# Patient Record
Sex: Female | Born: 1965 | Race: Black or African American | Hispanic: No | Marital: Married | Smoking: Never smoker
Health system: Southern US, Community
[De-identification: ages and names within clinical notes are randomized; demographics above are authoritative.]

## PROBLEM LIST (undated history)

## (undated) HISTORY — PX: TUBAL LIGATION: SHX77

## (undated) HISTORY — PX: UTERINE FIBROID SURGERY: SHX826

---

## 1999-05-16 ENCOUNTER — Other Ambulatory Visit: Admission: RE | Admit: 1999-05-16 | Discharge: 1999-05-16 | Payer: Self-pay | Admitting: *Deleted

## 1999-06-26 ENCOUNTER — Inpatient Hospital Stay (HOSPITAL_COMMUNITY): Admission: AD | Admit: 1999-06-26 | Discharge: 1999-06-26 | Payer: Self-pay | Admitting: Obstetrics and Gynecology

## 1999-07-11 ENCOUNTER — Encounter: Payer: Self-pay | Admitting: Internal Medicine

## 1999-07-11 ENCOUNTER — Ambulatory Visit (HOSPITAL_COMMUNITY): Admission: RE | Admit: 1999-07-11 | Discharge: 1999-07-11 | Payer: Self-pay | Admitting: Internal Medicine

## 1999-10-20 ENCOUNTER — Inpatient Hospital Stay (HOSPITAL_COMMUNITY): Admission: AD | Admit: 1999-10-20 | Discharge: 1999-10-20 | Payer: Self-pay | Admitting: Obstetrics & Gynecology

## 1999-11-07 ENCOUNTER — Inpatient Hospital Stay (HOSPITAL_COMMUNITY): Admission: AD | Admit: 1999-11-07 | Discharge: 1999-11-07 | Payer: Self-pay | Admitting: Obstetrics & Gynecology

## 1999-11-08 ENCOUNTER — Inpatient Hospital Stay (HOSPITAL_COMMUNITY): Admission: AD | Admit: 1999-11-08 | Discharge: 1999-11-08 | Payer: Self-pay | Admitting: *Deleted

## 1999-11-24 ENCOUNTER — Inpatient Hospital Stay (HOSPITAL_COMMUNITY): Admission: AD | Admit: 1999-11-24 | Discharge: 1999-11-24 | Payer: Self-pay | Admitting: *Deleted

## 1999-11-26 ENCOUNTER — Inpatient Hospital Stay (HOSPITAL_COMMUNITY): Admission: AD | Admit: 1999-11-26 | Discharge: 1999-11-28 | Payer: Self-pay | Admitting: Obstetrics and Gynecology

## 2000-01-10 ENCOUNTER — Ambulatory Visit (HOSPITAL_COMMUNITY): Admission: RE | Admit: 2000-01-10 | Discharge: 2000-01-10 | Payer: Self-pay | Admitting: Obstetrics and Gynecology

## 2003-11-03 ENCOUNTER — Emergency Department (HOSPITAL_COMMUNITY): Admission: AD | Admit: 2003-11-03 | Discharge: 2003-11-03 | Payer: Self-pay | Admitting: Family Medicine

## 2004-06-29 ENCOUNTER — Emergency Department (HOSPITAL_COMMUNITY): Admission: EM | Admit: 2004-06-29 | Discharge: 2004-06-30 | Payer: Self-pay | Admitting: Emergency Medicine

## 2006-10-08 ENCOUNTER — Other Ambulatory Visit: Admission: RE | Admit: 2006-10-08 | Discharge: 2006-10-08 | Payer: Self-pay | Admitting: Obstetrics and Gynecology

## 2007-03-10 ENCOUNTER — Emergency Department (HOSPITAL_COMMUNITY): Admission: EM | Admit: 2007-03-10 | Discharge: 2007-03-10 | Payer: Self-pay | Admitting: Emergency Medicine

## 2007-08-04 ENCOUNTER — Emergency Department (HOSPITAL_COMMUNITY): Admission: EM | Admit: 2007-08-04 | Discharge: 2007-08-04 | Payer: Self-pay | Admitting: Family Medicine

## 2007-11-28 ENCOUNTER — Emergency Department (HOSPITAL_COMMUNITY): Admission: EM | Admit: 2007-11-28 | Discharge: 2007-11-28 | Payer: Self-pay | Admitting: Emergency Medicine

## 2010-12-22 ENCOUNTER — Encounter: Payer: Self-pay | Admitting: Obstetrics and Gynecology

## 2011-04-18 NOTE — Op Note (Signed)
[facility]  Patient:    Katrina Barrett                      MRN: [id number] Proc. Date: 11/26/99 Adm. Date:  [id number] Attending:  Leonard Schwartz                           Operative Report  PREOPERATIVE DIAGNOSES:       1. Term intrauterine pregnancy.                               2. Fetal tachycardia.                               3. Chorioamnionitis.  POSTOPERATIVE DIAGNOSES:      1. Term intrauterine pregnancy.                               2. Fetal tachycardia.                               3. Chorioamnionitis.  OPERATION:                    Vacuum extraction vaginal delivery with repair of                               third degree laceration.  SURGEON:                      Katrina Barrett, M.D.  ASSISTANT:  ANESTHESIA:                   Epidural  DISPOSITION:                  Katrina Barrett is a 45 year old female, Gravida 4, Para 1-0-2-1, who presents at [redacted] weeks gestation for induction of labor because of a  nonreassuring fetal heart rate tracing in the office.  Her membranes were ruptured at approximately 3:30 p.m. and the patient labored quickly after Pitocin was added. She was noted to be completely dilated at 9:15 p.m.  The patient then had a temperature to 100.4 and the fetal heart rate baseline increased to 170 to 180 beats per minute. She continued to have variable decelerations with each contraction.  She was able to push the babys head to a +3 station but began to tire of pushing.  The patient was given the option of continued pushing, observation, cesarean delivery, forceps delivery, and vacuum extraction vaginal  delivery.  Vacuum extraction vaginal delivery was recommended.  The patient accepted a vacuum extraction.  The risks and benefits of each of those procedures were reviewed.  The specific risks associated with vacuum extraction were reviewed including caput formation, hematoma formation and the rare  risk of intracranial  bleeding.  She understood that there was a possibility that the vacuum extractor would be unsuccessful and that a cesarean section would be needed.  The risks and benefits of cesarean delivery were reviewed including anesthetic complications,  bleeding, infections and possible damage to the surrounding organs.  FINDINGS:  The weight of the infant is currently not known.  female infant was delivered (a manual) from a left occiput posterior position. The Apgars were 8 at one minute and 9 at five minutes.  There was a partial third degree laceration present.  There were no upper vaginal or cervical lacerations  present.  The placenta had normal surfaces. There was a three vessel cord present. The infant was delivered at 10:57 p.m. and the placenta was delivered at 11:07 .m.  DESCRIPTION OF PROCEDURE:     The patient was placed in a lithotomy position. he perineum and vagina were prepped with Betadine.  The bladder was drained of urine. The patient was sterilely draped.  The cervix was completely dilated and 100% effaced.  The presenting part was at a +3 station. The Kiwi vacuum extractor was applied and the patient was able to deliver the fetal head with three pushes. ne pop off did occur.  The mouth and nose were suctioned.  The remainder of the infant was delivered.  The cord was clamped and cut and the infant was placed on the mothers abdomen for bonding with both parents.  Routine cord blood studies were  obtained.  The placenta was removed.  The third degree laceration was repaired n layers using two sutures of 0 chromic catgut. Hemostasis was adequate at the end of our procedure.  The upper vagina was inspected and no lacerations were noted. he patient was returned to the supine position.  A tubal ligation was scheduled for November 27, 1999. DD:  11/26/99 TD:  11/28/99 Job: [id number]

## 2011-04-18 NOTE — H&P (Signed)
[facility]  Patient:    Katrina Barrett                      MRN: [id number] Adm. Date:  [id number] Attending:  Leonard Schwartz                         History and Physical  HISTORY OF PRESENT ILLNESS:   Katrina Barrett is a 45 year old female, gravida 4, para 1-0-2-1, who presents at [redacted] weeks gestation ([facility] December 03, 1999), for induction of labor.  The patient has been followed at [facility] for this pregnancy that has been complicated by the fact that the patient has fibroids.  The patient presented to the office today complaining of  decreased fetal motion.  A nonstress test was performed and the patient was noted to have contractions with decelerations following each contraction.  The decision was made to proceed with delivery.  The patient desires permanent sterilization.  OBSTETRICAL HISTORY:          In 1996 the patient had a vacuum extraction vaginal delivery of a 8 pound female infant at term.  In 1985 and again in 1997 the patient had a first trimester elective pregnancy termination.  PAST MEDICAL HISTORY:         The patient denies hypertension and diabetes. She had her wisdom teeth removed in 1999 and she had two first trimester pregnancy terminations.  DRUG ALLERGIES:               None.  SOCIAL HISTORY:               The patient is married and she is self-employed. She denies cigarette use, alcohol use, and recreational drug use.  REVIEW OF SYSTEMS:            Normal pregnancy complaints.  FAMILY HISTORY:               The patients mother has chronic hypertension.  PHYSICAL EXAMINATION  VITAL SIGNS:                  The patient is afebrile and her vital signs are stable.  HEENT:                        Within normal limits.  CHEST:                        Clear.  CARDIAC:                      Heart regular rate and rhythm.  There is a grade 1-2/6 systolic murmur.  ABDOMEN:                       Gravid with a fundal height of 38 cm.  EXTREMITIES:                  Within normal limits.  NEUROLOGIC:                   Normal.  CERVIX:                       The cervix is 2-3 cm dilated.  PROCEDURES:                   Nonstress test  is reactive.  LABORATORY VALUES:            Blood type is O positive.  Antibody screen negative. VDRL nonreactive.  Rubella immune.  HBSAG negative.  Alpha fetoprotein within normal limits.  Glucola screen within normal limits.  GC negative.  Chlamydia negative.  Pap is within normal limits.  Third trimester beta Streptococcus not  done because the patient has a history of positive beta Streptococcus in the past.  ASSESSMENT:                   1. Term intrauterine pregnancy.                               2. Decelerations on nonstress test.                               3. Positive beta Streptococcus history.                               4. Desire permanent sterilization.  PLAN:                         1. The patient will be admitted for induction of                                  labor.                               2. She will receive antibiotic prophylaxis.                               3. The patient will have a postpartum tubal                                  ligation. DD:  11/26/99 TD:  11/26/99 Job: [id number]

## 2011-04-18 NOTE — Op Note (Signed)
[facility]  Patient:    Katrina Barrett, Katrina Barrett                     MRN: [id number] Proc. Date: 01/10/00 Adm. Date:  [id number] Attending:  Leonard Schwartz                           Operative Report  PREOPERATIVE DIAGNOSES:       1. Desires sterilization.                               2. Fibroid uterus.  POSTOPERATIVE DIAGNOSES:      1. Desires sterilization.                               2. Fibroid uterus.  OPERATION:                    Laparoscopic tubal cautery.  SURGEON:                      Janine Limbo, M.D.  ANESTHESIA:                   General.  DISPOSITION:                  The patient is a 45 year old female, para 2-0-2-2, who desires permanent sterilization.  She understands the indications for her procedure, and she accepts the risks of, but not limited to anesthetic complications, bleeding, infection, possible damage to the surrounding organs, nd possible tubal failure (10-12 per 1000).  FINDINGS:                     The fallopian tubes, ovaries, bowel, and liver appeared normal.  There was a 2 cm fibroid present on the right fundus of the uterus, and a 1-2 cm fibroid is present on the anterior portion of the uterus.  DESCRIPTION OF PROCEDURE:     The patient was taken to the operating room where a general anesthetic was given.  The abdomen, perineum, and vagina were prepped with multiple layers of Betadine.  The bladder was drained of urine.  A Hulka tenaculum was placed inside the uterus.  The patient was sterilely draped.  The subumbilical area was injected with 5 cc of 0.25% Marcaine.  An incision was made and the Veress needle was inserted into the abdominal cavity without difficulty.  Proper placement was confirmed using the saline drop test.  A pneumoperitoneum was obtained. The laparoscopic trocar and the laparoscope were substituted for the Veress needle.  The pelvic structures were identified with  findings as mentioned above.  The ___ was checked and there was no evidence of _______.  The right fallopian tube was  identified and followed to its fimbriated end.  The proximal portion of the right fallopian tube was cauterized using electrocautery.  Hemostasis was adequate. n identical procedure was carried out on the opposite side.  Again, hemostasis was adequate.  The tube was noted to be totally cauterized.  The pneumoperitoneum was allowed to escape.  All instruments were removed.  The incision was closed using deep and superficial sutures of 4-0 Vicryl.  Sponge count, needle count, and instrument counts were correct on two occasions.  The estimated blood loss was  5 cc.  The patient tolerated her procedure well.  She was taken to the recovery room in stable condition. DD:  01/10/00 TD:  01/12/00 Job: [id number]

## 2011-07-22 ENCOUNTER — Inpatient Hospital Stay (INDEPENDENT_AMBULATORY_CARE_PROVIDER_SITE_OTHER)
Admission: RE | Admit: 2011-07-22 | Discharge: 2011-07-22 | Disposition: A | Discharge status: Home / Self Care | Payer: Self-pay | Source: Ambulatory Visit | Attending: Family Medicine | Admitting: Family Medicine

## 2011-07-22 DIAGNOSIS — R1013 Epigastric pain: Secondary | ICD-10-CM

## 2011-09-12 LAB — POCT URINALYSIS DIP (DEVICE)
Bilirubin Urine: NEGATIVE
Glucose, UA: NEGATIVE
Ketones, ur: NEGATIVE
Nitrite: NEGATIVE
Operator id: 247071
Protein, ur: NEGATIVE
Specific Gravity, Urine: 1.02
Urobilinogen, UA: 0.2
pH: 6

## 2011-09-12 LAB — POCT PREGNANCY, URINE
Operator id: 247071
Preg Test, Ur: NEGATIVE

## 2011-09-12 LAB — GC/CHLAMYDIA PROBE AMP, GENITAL
Chlamydia, DNA Probe: NEGATIVE
GC Probe Amp, Genital: NEGATIVE

## 2011-09-12 LAB — WET PREP, GENITAL
Trich, Wet Prep: NONE SEEN
Yeast Wet Prep HPF POC: NONE SEEN

## 2014-08-04 ENCOUNTER — Other Ambulatory Visit: Payer: Self-pay | Admitting: Obstetrics and Gynecology

## 2014-08-04 DIAGNOSIS — Z1231 Encounter for screening mammogram for malignant neoplasm of breast: Secondary | ICD-10-CM

## 2014-09-12 ENCOUNTER — Encounter (HOSPITAL_COMMUNITY): Payer: Self-pay

## 2014-09-12 ENCOUNTER — Ambulatory Visit (HOSPITAL_COMMUNITY)
Admission: RE | Admit: 2014-09-12 | Discharge: 2014-09-12 | Disposition: A | Discharge status: Home / Self Care | Payer: Self-pay | Source: Ambulatory Visit | Attending: Obstetrics and Gynecology | Admitting: Obstetrics and Gynecology

## 2014-09-12 VITALS — BP 120/82 | Temp 98.8°F | Ht 66.0 in | Wt 144.0 lb

## 2014-09-12 DIAGNOSIS — Z01419 Encounter for gynecological examination (general) (routine) without abnormal findings: Secondary | ICD-10-CM

## 2014-09-12 DIAGNOSIS — Z1231 Encounter for screening mammogram for malignant neoplasm of breast: Secondary | ICD-10-CM

## 2014-09-12 NOTE — Patient Instructions (Signed)
Explained to Faythe Lagerstrom that [facility] will cover Pap smears and co-testing every 5 years unless has a history of abnormal Pap smears. Due to patient having an abnormal Pap smear 15 years ago and patient stating she has only had one Pap smear since if today's Pap smear is normal she will need her next Pap smear in 3 years. Let patient know will follow up with her within the next couple weeks with results of Pap smear by phone. Acacia Yerger verbalized understanding. Patient escorted to mammography for a screening mammogram.

## 2014-09-12 NOTE — Progress Notes (Signed)
No complaints today.  Pap Smear:  Pap smear completed today. Patients last Pap [facility] and normal per patient. Per patient has a history of an abnormal Pap smear 15 years ago that a colposcopy was completed for follow-up. Per patient her most recent Pap smear is the only Pap smear she has had completed since colposcopy. No Pap smear results in EPIC.  Physical exam: Breasts Breasts symmetrical. No skin abnormalities bilateral breasts. No nipple retraction bilateral breasts. No nipple discharge bilateral breasts. No lymphadenopathy. No lumps palpated bilateral breasts. No complaints of pain or tenderness on exam. Patient escort to mammography for a screening mammogram.         Pelvic/Bimanual   Ext Genitalia No lesions, no swelling and no discharge observed on external genitalia.         Vagina Vagina pink and normal texture. No lesions or discharge observed in vagina.          Cervix Cervix is present. Cervix pink and of normal texture. No discharge observed.     Uterus Uterus is present and palpable. Uterus in tilted to the left and normal size.        Adnexae Bilateral ovaries present and palpable. No tenderness on palpation.          Rectovaginal No rectal exam completed today since patient had no rectal complaints. No skin abnormalities observed on exam.

## 2014-09-13 ENCOUNTER — Other Ambulatory Visit: Payer: Self-pay | Admitting: Obstetrics and Gynecology

## 2014-09-13 DIAGNOSIS — R928 Other abnormal and inconclusive findings on diagnostic imaging of breast: Secondary | ICD-10-CM

## 2014-09-14 ENCOUNTER — Ambulatory Visit: Payer: Self-pay

## 2014-09-14 ENCOUNTER — Other Ambulatory Visit: Payer: Self-pay

## 2014-09-14 LAB — CYTOLOGY - PAP

## 2014-09-26 ENCOUNTER — Ambulatory Visit
Admission: RE | Admit: 2014-09-26 | Discharge: 2014-09-26 | Disposition: A | Discharge status: Home / Self Care | Payer: No Typology Code available for payment source | Source: Ambulatory Visit | Attending: Obstetrics and Gynecology | Admitting: Obstetrics and Gynecology

## 2014-09-26 ENCOUNTER — Other Ambulatory Visit: Payer: Self-pay

## 2014-09-26 DIAGNOSIS — R928 Other abnormal and inconclusive findings on diagnostic imaging of breast: Secondary | ICD-10-CM

## 2014-10-02 ENCOUNTER — Encounter (HOSPITAL_COMMUNITY): Payer: Self-pay

## 2014-10-03 ENCOUNTER — Ambulatory Visit (HOSPITAL_BASED_OUTPATIENT_CLINIC_OR_DEPARTMENT_OTHER): Payer: Self-pay

## 2014-10-03 ENCOUNTER — Ambulatory Visit: Payer: Self-pay

## 2014-10-03 VITALS — BP 130/80 | HR 66 | Temp 98.4°F | Resp 14 | Ht 65.75 in | Wt 144.2 lb

## 2014-10-03 DIAGNOSIS — Z0000 Encounter for general adult medical examination without abnormal findings: Secondary | ICD-10-CM

## 2014-10-03 LAB — LIPID PANEL
Cholesterol: 141 mg/dL (ref 0–200)
HDL: 46 mg/dL (ref >39)
LDL CALC: 85 mg/dL (ref 0–99)
Total CHOL/HDL Ratio: 3.1 Ratio
Triglycerides: 49 mg/dL (ref <150)
VLDL: 10 mg/dL (ref 0–40)

## 2014-10-03 LAB — HEMOGLOBIN A1C
HEMOGLOBIN A1C: 5.7 % — AB (ref <5.7)
Mean Plasma Glucose: 117 mg/dL — ABNORMAL HIGH (ref <117)

## 2014-10-03 LAB — GLUCOSE (CC13): Glucose: 77 mg/dl (ref 70–140)

## 2014-10-03 NOTE — Progress Notes (Signed)
[facility] and is currently a [facility] patient effective 09/12/2014.   Clinical Measurements: Patient is 5 ft. 5 3/4 inches, weight 144.2 lbs, waist circumference 30.5 inches, and hip circumference 38 inches.   Medical History: Patient states has no history of high cholesterol or diabetes.Patient states that does not have a history of hypertension. Per patient no diagnosed history of coronary heart disease, heart attack, heart failure, stroke/TIA, vascular disease or congenital heart defects.   Blood Pressure, Self-measurement: Patient states that does not check Blood pressure.  Nutrition Assessment: Patient stated that eats 1-2 fruits every day. Patient states she eats one to 3 to 4 servings of vegetables a day. Per patient eats 3 or more ounces of whole grains daily. Patient states doesn't eat two or more servings of fish weekly. Patient states she does not drink more than 36 ounces or 450 calories of beverages with added sugars weekly. Patient stated she does watch her salt intake.   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  Smoking Status: Patient had never smoked and is not around smoke.  Quality of Life Assessment: In assessing patient's quality of life she stated that out of the past 30 days that she has felt her Physical health was good all but 6 days. Patient also stated that in the past 30 days that her mental health was not good including stress, depression and problems with emotions for 4 days. Patient did state that out of the past 30 days she felt her physical or mental health had not kept her from doing her usual activities including self-care, work or recreation.   Plan: Lab work will be done today including a lipid panel, blood glucose, and Hgb A1C. Will  call lab results when they are finished. Will discuss Health Coaching today using the [place] [facility] or other program when call lab results..Marland Kitchen

## 2014-10-03 NOTE — Patient Instructions (Signed)
Discussed health assessment with patient. Patient will be called with results of lab work and we will then discussed any further follow up the patient needs. Patient will implement behavior modifications to help lower BP. Patient verbalized understanding.

## 2014-10-05 ENCOUNTER — Telehealth: Payer: Self-pay

## 2014-10-05 NOTE — Telephone Encounter (Signed)
Called to inform about lab work from 10/03/14. I told patient: cholesterol- 141, HDL- 46, LDL- 85, triglycerides - 49, Bld Glucose -77and HBG-AiC - 5.7.   Informed her that we needed to look at her nutrition and activity based on her A1C being borderline or pre-diabectic range.Discussed cutting back on starchy foods, citing example like rice, corn, pasta, etc.. Informed that those items need to be higher in fiber content. Discussed that needs to cut back on sweets and use artificial sweetener. Patient will call back for a more thorough health coaching to prevent diabetes.

## 2014-10-05 NOTE — Telephone Encounter (Signed)
Called to give lab results. Left message for patient to call me back or I'll try again on Friday.

## 2014-11-03 ENCOUNTER — Ambulatory Visit: Payer: Self-pay

## 2014-11-20 ENCOUNTER — Telehealth: Payer: Self-pay

## 2014-11-20 NOTE — Telephone Encounter (Signed)
Called to see if was doing well with decreasing sugars and carbohydrates. Informed that we would retest A1C the beginning of February to see if it has come down

## 2015-01-04 ENCOUNTER — Emergency Department (INDEPENDENT_AMBULATORY_CARE_PROVIDER_SITE_OTHER)
Admission: EM | Admit: 2015-01-04 | Discharge: 2015-01-04 | Disposition: A | Discharge status: Home / Self Care | Payer: Self-pay | Source: Home / Self Care | Attending: Family Medicine | Admitting: Family Medicine

## 2015-01-04 ENCOUNTER — Encounter (HOSPITAL_COMMUNITY): Payer: Self-pay | Admitting: Emergency Medicine

## 2015-01-04 DIAGNOSIS — G43019 Migraine without aura, intractable, without status migrainosus: Secondary | ICD-10-CM

## 2015-01-04 MED ORDER — KETOROLAC TROMETHAMINE 60 MG/2ML IM SOLN
INTRAMUSCULAR | Status: AC
  Filled 2015-01-04: qty 2

## 2015-01-04 MED ORDER — KETOROLAC TROMETHAMINE 60 MG/2ML IM SOLN
60.0000 mg | Freq: Once | INTRAMUSCULAR | Status: AC
  Administered 2015-01-04: 60 mg via INTRAMUSCULAR

## 2015-01-04 MED ORDER — SUMATRIPTAN SUCCINATE 6 MG/0.5ML ~~LOC~~ SOLN
SUBCUTANEOUS | Status: AC
  Filled 2015-01-04: qty 0.5

## 2015-01-04 MED ORDER — SUMATRIPTAN SUCCINATE 6 MG/0.5ML ~~LOC~~ SOLN
6.0000 mg | Freq: Once | SUBCUTANEOUS | Status: AC
  Administered 2015-01-04: 6 mg via SUBCUTANEOUS

## 2015-01-04 MED ORDER — METOCLOPRAMIDE HCL 5 MG/ML IJ SOLN
INTRAMUSCULAR | Status: AC
Start: 2015-01-04 — End: 2015-01-04
  Filled 2015-01-04: qty 2

## 2015-01-04 MED ORDER — DEXAMETHASONE SODIUM PHOSPHATE 10 MG/ML IJ SOLN
10.0000 mg | Freq: Once | INTRAMUSCULAR | Status: AC
  Administered 2015-01-04: 10 mg via INTRAMUSCULAR

## 2015-01-04 MED ORDER — DEXAMETHASONE SODIUM PHOSPHATE 10 MG/ML IJ SOLN
INTRAMUSCULAR | Status: AC
  Filled 2015-01-04: qty 1

## 2015-01-04 MED ORDER — METOCLOPRAMIDE HCL 5 MG/ML IJ SOLN
5.0000 mg | Freq: Once | INTRAMUSCULAR | Status: AC
  Administered 2015-01-04: 5 mg via INTRAMUSCULAR

## 2015-01-04 NOTE — Discharge Instructions (Signed)
Thank you for coming in today. Go to the emergency room if your headache becomes excruciating or you have weakness or numbness or uncontrolled vomiting.  Take 2 Benadryl (50 mg ) when you get home and take a nap.  Return as needed   Migraine Headache A migraine headache is an intense, throbbing pain on one or both sides of your head. A migraine can last for 30 minutes to several hours. CAUSES  The exact cause of a migraine headache is not always known. However, a migraine may be caused when nerves in the brain become irritated and release chemicals that cause inflammation. This causes pain. Certain things may also trigger migraines, such as:  Alcohol.  Smoking.  Stress.  Menstruation.  Aged cheeses.  Foods or drinks that contain nitrates, glutamate, aspartame, or tyramine.  Lack of sleep.  Chocolate.  Caffeine.  Hunger.  Physical exertion.  Fatigue.  Medicines used to treat chest pain (nitroglycerine), birth control pills, estrogen, and some blood pressure medicines. SIGNS AND SYMPTOMS  Pain on one or both sides of your head.  Pulsating or throbbing pain.  Severe pain that prevents daily activities.  Pain that is aggravated by any physical activity.  Nausea, vomiting, or both.  Dizziness.  Pain with exposure to bright lights, loud noises, or activity.  General sensitivity to bright lights, loud noises, or smells. Before you get a migraine, you may get warning signs that a migraine is coming (aura). An aura may include:  Seeing flashing lights.  Seeing bright spots, halos, or zigzag lines.  Having tunnel vision or blurred vision.  Having feelings of numbness or tingling.  Having trouble talking.  Having muscle weakness. DIAGNOSIS  A migraine headache is often diagnosed based on:  Symptoms.  Physical exam.  A CT scan or MRI of your head. These imaging tests cannot diagnose migraines, but they can help rule out other causes of  headaches. TREATMENT Medicines may be given for pain and nausea. Medicines can also be given to help prevent recurrent migraines.  HOME CARE INSTRUCTIONS  Only take over-the-counter or prescription medicines for pain or discomfort as directed by your health care provider. The use of long-term narcotics is not recommended.  Lie down in a dark, quiet room when you have a migraine.  Keep a journal to find out what may trigger your migraine headaches. For example, write down:  What you eat and drink.  How much sleep you get.  Any change to your diet or medicines.  Limit alcohol consumption.  Quit smoking if you smoke.  Get 7-9 hours of sleep, or as recommended by your health care provider.  Limit stress.  Keep lights dim if bright lights bother you and make your migraines worse. SEEK IMMEDIATE MEDICAL CARE IF:   Your migraine becomes severe.  You have a fever.  You have a stiff neck.  You have vision loss.  You have muscular weakness or loss of muscle control.  You start losing your balance or have trouble walking.  You feel faint or pass out.  You have severe symptoms that are different from your first symptoms. MAKE SURE YOU:   Understand these instructions.  Will watch your condition.  Will get help right away if you are not doing well or get worse. Document Released: 11/17/2005 Document Revised: 04/03/2014 Document Reviewed: 07/25/2013 [facility] Patient Information 2015 [place]. This information is not intended to replace advice given to you by your health care provider. Make sure you discuss any questions you  have with your health care provider. ° °

## 2015-01-04 NOTE — ED Notes (Signed)
C/o headache since last Tuesday.  Pt states that it was due to her straining her eyes.  Pt purchased new glasses on Saturday with no relief in headache.  Mild nausea and sensitive to light.  Dizzy.  Denies any other symptoms.

## 2015-01-04 NOTE — ED Provider Notes (Signed)
Katrina Barrett is a 49 y.o. female who presents to Urgent Care today for headache. Patient has a headache associated with some nausea and mild dizziness present for the last 2 days. She has photophobia. She denies any vomiting weakness or numbness loss of function. She describes a vague dizziness sensation. She denies any syncope or significant room spinning. She's tried some generic Excedrin which has helped a bit.   History reviewed. No pertinent past medical history. Past Surgical History  Procedure Laterality Date  . Tubal ligation    . Uterine fibroid surgery     History  Substance Use Topics  . Smoking status: Never Smoker   . Smokeless tobacco: Never Used  . Alcohol Use: No   ROS as above Medications: No current facility-administered medications for this encounter.   No current outpatient prescriptions on file.   No Known Allergies   Exam:  BP 165/86 mmHg  Pulse 63  Temp(Src) 98.2 F (36.8 C) (Oral)  Resp 14  SpO2 100%  LMP 08/26/2014 Gen: Well NAD HEENT: EOMI,  MMM PERRL Lungs: Normal work of breathing. CTABL Heart: RRR no MRG Abd: NABS, Soft. Nondistended, Nontender Exts: Brisk capillary refill, warm and well perfused.  Neuro: Alert and oriented cranial nerves II through XII are intact normal coordination strength sensation balance and reflexes.  Patient was given 10 mg IM dexamethasone, 60 mg IM Toradol, 5 mg IM Reglan, and 6 mg [place] Imitrex prior to discharge. Patient felt much better  No results found for this or any previous visit (from the past 24 hour(s)). No results found.  Assessment and Plan: 49 y.o. female with migraine headache. Normal neurologic exam. Blood pressure is elevated but I think probably not contributing to this problem. Treat with medications as above. Patient felt much better. Return home and follow-up as needed.  Discussed warning signs or symptoms. Please see discharge instructions. Patient expresses understanding.     Rodolph BongEvan S  Mikiah Durall, MD 01/04/15 [phone]

## 2015-02-13 ENCOUNTER — Telehealth (HOSPITAL_COMMUNITY): Payer: Self-pay | Admitting: *Deleted

## 2015-02-13 NOTE — Telephone Encounter (Signed)
Called patient to remind her to [facility]. Told patient [facility] will cover. Gave patient phone number to the [facility] and patient stated will call schedule appointment. Patient verbalized understanding.

## 2015-04-04 ENCOUNTER — Telehealth (HOSPITAL_COMMUNITY): Payer: Self-pay | Admitting: *Deleted

## 2015-04-04 NOTE — Telephone Encounter (Signed)
Telephoned patient at home # and advised was time to schedule follow up with the [facility]. Pt states now has insurance. Pt will call and schedule and file insurance. Advised patient of negative pap smear results and negative HPV. Next pap smear due in 5 years. Patient voice understanding.

## 2015-04-10 ENCOUNTER — Other Ambulatory Visit: Payer: Self-pay | Admitting: Internal Medicine

## 2015-04-10 DIAGNOSIS — N63 Unspecified lump in unspecified breast: Secondary | ICD-10-CM

## 2015-04-18 ENCOUNTER — Other Ambulatory Visit: Payer: Self-pay | Admitting: Obstetrics and Gynecology

## 2015-04-18 DIAGNOSIS — N63 Unspecified lump in unspecified breast: Secondary | ICD-10-CM

## 2015-04-19 ENCOUNTER — Other Ambulatory Visit: Payer: Self-pay

## 2015-04-19 ENCOUNTER — Ambulatory Visit
Admission: RE | Admit: 2015-04-19 | Discharge: 2015-04-19 | Disposition: A | Discharge status: Home / Self Care | Payer: 59 | Source: Ambulatory Visit | Attending: Internal Medicine | Admitting: Internal Medicine

## 2015-04-19 DIAGNOSIS — N63 Unspecified lump in unspecified breast: Secondary | ICD-10-CM

## 2015-11-05 IMAGING — US US BREAST LTD UNI RIGHT INC AXILLA
1 series · 5 of 5 positions shown · non-contrast
Comparison: 09/26/2014

CLINICAL DATA: Follow-up of probable fibroadenoma right breast.

EXAM:
ULTRASOUND OF THE RIGHT BREAST

[Series 1: us breast ltd uni right inc axilla · 0.06mm/px · 5 of 5 slices shown]
[im 1/5]
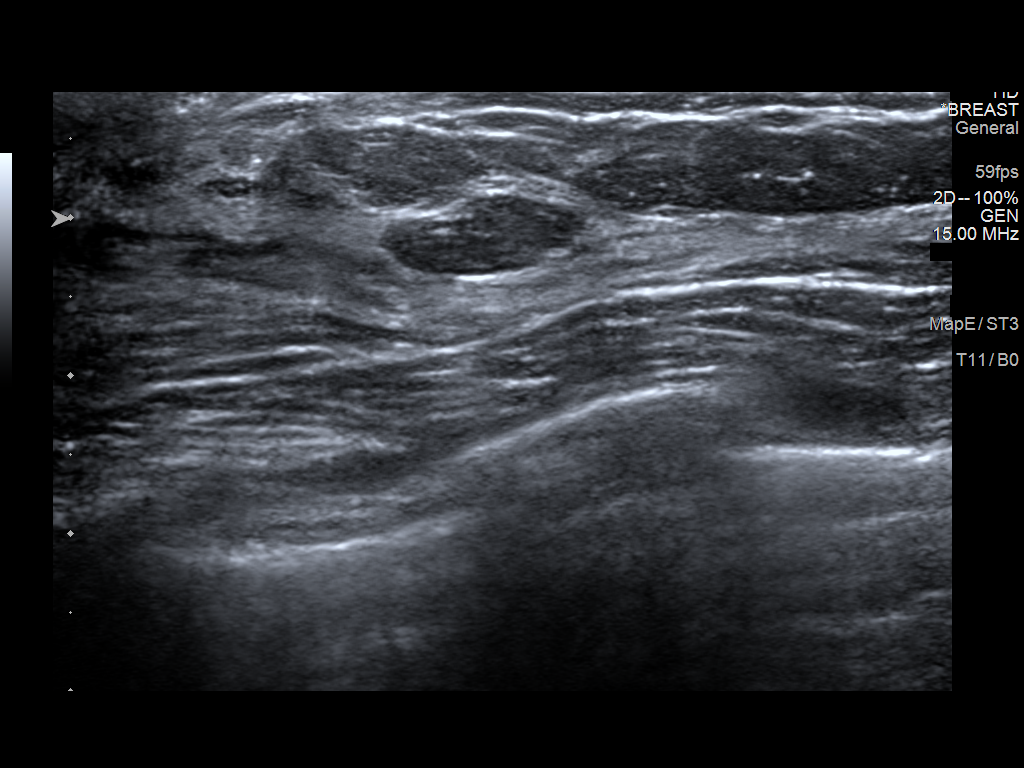
[im 2/5]
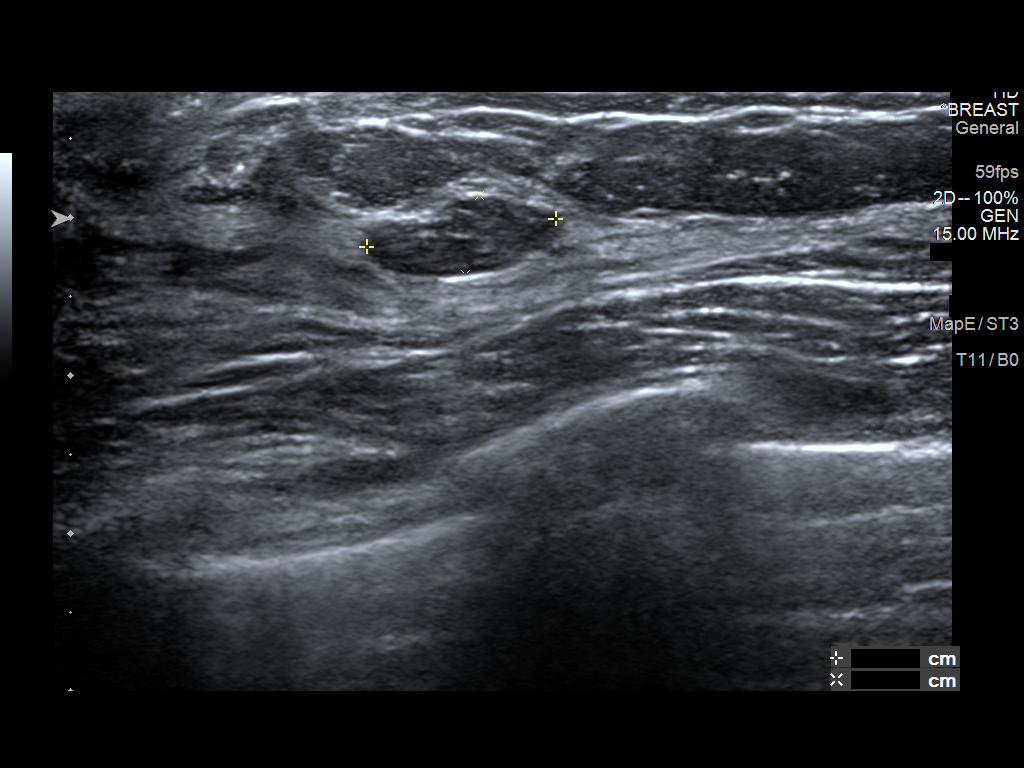
[im 3/5]
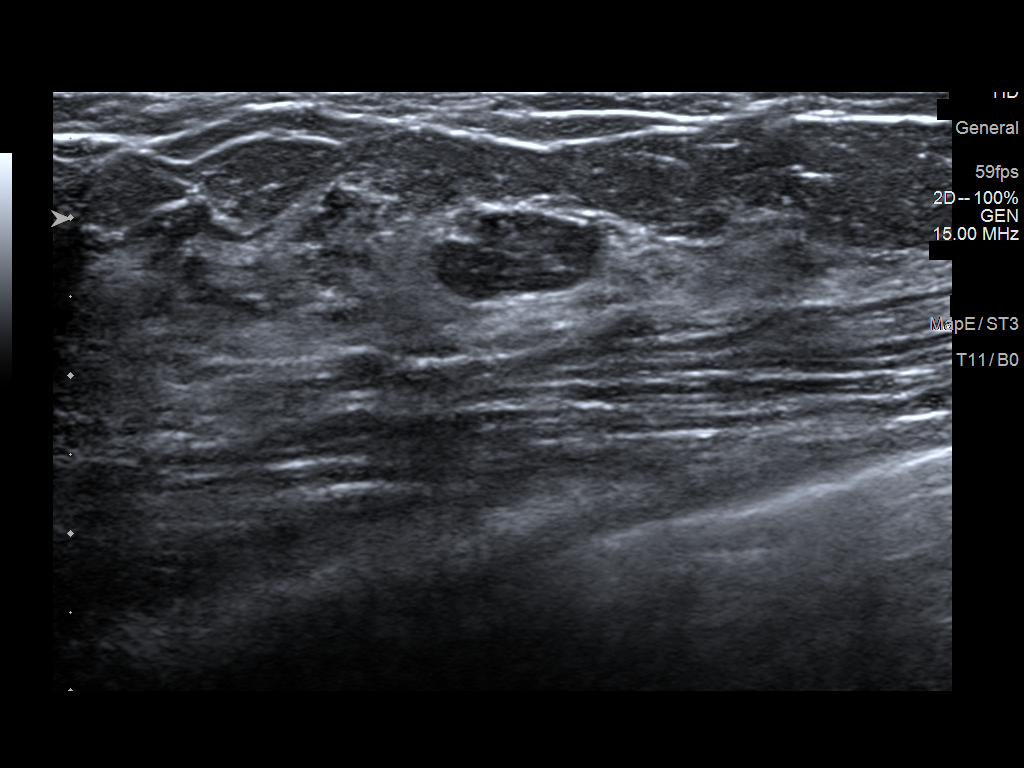
[im 4/5]
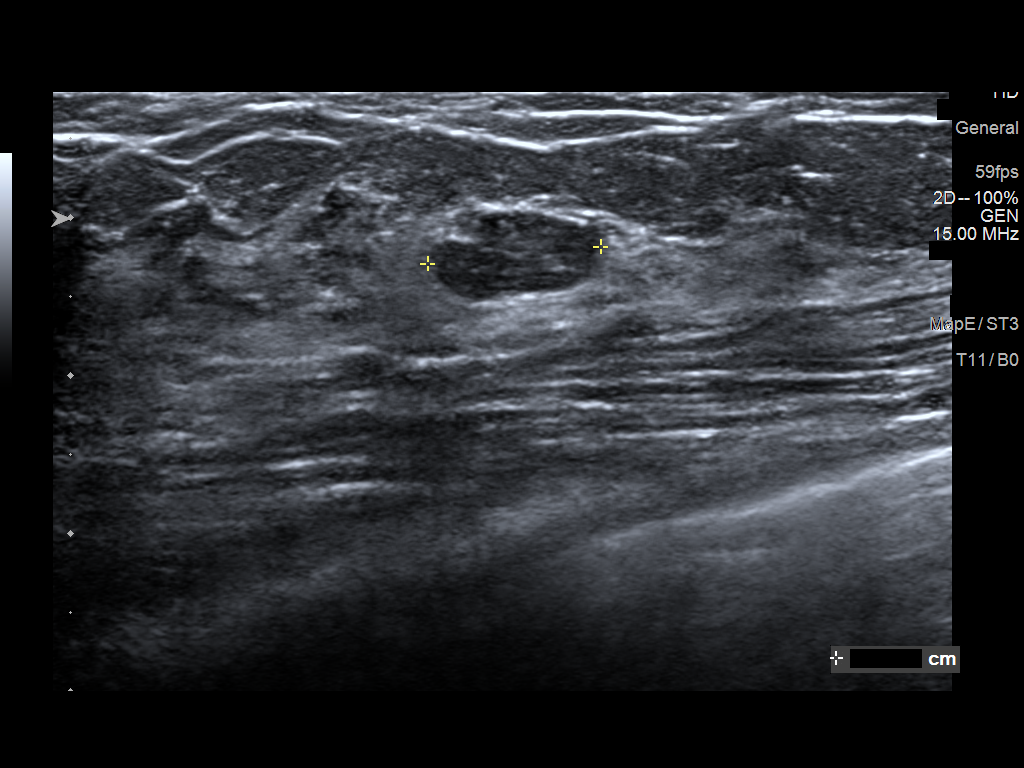
[im 5/5]
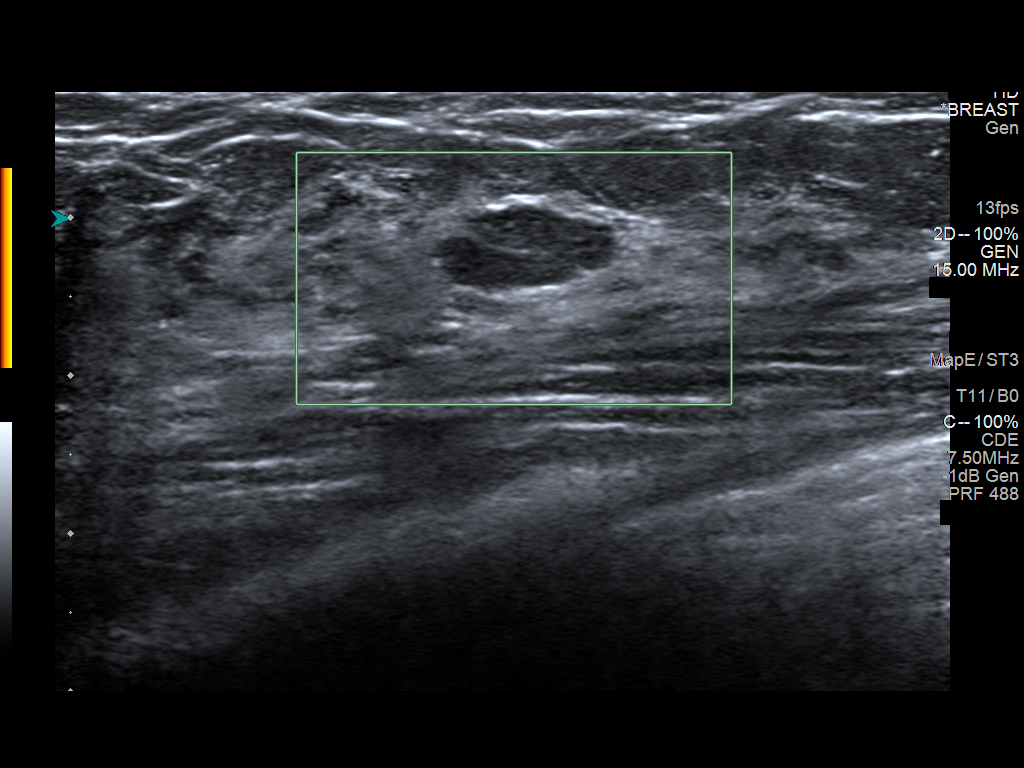

[5 of 5 positions shown; findings below may reference images not displayed]

FINDINGS: On physical exam, I do not palpate a mass in the 2 o'clock region of
the right breast.

Targeted ultrasound is performed, showing a homogeneous
circumscribed hypoechoic mass oriented parallel to the chest wall at
2 o'clock position 3 cm from the nipple. This mass contains internal
echogenic bands and measures 1.2 x 0.5 x 1.1 cm. No internal
vascular flow detected. The mass is stable in size.
IMPRESSION: Stable probable fibroadenoma right breast.

RECOMMENDATION:
Bilateral diagnostic mammogram and right breast ultrasound is
recommended in September 2015.

I have discussed the findings and recommendations with the patient.
Results were also provided in writing at the conclusion of the
visit. If applicable, a reminder letter will be sent to the patient
regarding the next appointment.

BI-RADS CATEGORY  3: Probably benign.

## 2016-11-27 ENCOUNTER — Other Ambulatory Visit: Payer: Self-pay | Admitting: Physician Assistant

## 2016-11-27 DIAGNOSIS — N63 Unspecified lump in unspecified breast: Secondary | ICD-10-CM

## 2016-12-11 ENCOUNTER — Other Ambulatory Visit: Payer: Self-pay

## 2016-12-15 ENCOUNTER — Ambulatory Visit
Admission: RE | Admit: 2016-12-15 | Discharge: 2016-12-15 | Disposition: A | Discharge status: Home / Self Care | Payer: BLUE CROSS/BLUE SHIELD | Source: Ambulatory Visit | Attending: Physician Assistant | Admitting: Physician Assistant

## 2016-12-15 DIAGNOSIS — N63 Unspecified lump in unspecified breast: Secondary | ICD-10-CM

## 2019-12-07 ENCOUNTER — Ambulatory Visit: Payer: BLUE CROSS/BLUE SHIELD | Attending: Internal Medicine

## 2019-12-07 DIAGNOSIS — Z20822 Contact with and (suspected) exposure to covid-19: Secondary | ICD-10-CM

## 2019-12-08 ENCOUNTER — Ambulatory Visit: Payer: BLUE CROSS/BLUE SHIELD | Admitting: Podiatry

## 2019-12-08 ENCOUNTER — Other Ambulatory Visit: Payer: Self-pay

## 2019-12-08 ENCOUNTER — Encounter: Payer: Self-pay | Admitting: Podiatry

## 2019-12-08 VITALS — BP 145/95 | HR 82

## 2019-12-08 DIAGNOSIS — L600 Ingrowing nail: Secondary | ICD-10-CM

## 2019-12-08 LAB — NOVEL CORONAVIRUS, NAA: SARS-CoV-2, NAA: NOT DETECTED

## 2019-12-08 MED ORDER — NEOMYCIN-POLYMYXIN-HC 3.5-10000-1 OT SOLN: OTIC | 0 refills | Status: DC

## 2019-12-08 NOTE — Patient Instructions (Signed)

## 2019-12-12 NOTE — Progress Notes (Signed)
Subjective:   Patient ID: Katrina Barrett, female   DOB: 54 y.o.   MRN: [id number]   HPI Patient presents with a very ingrown toenail right hallux that she states is hard to wear shoe gear with comfortably and has become gradually worse over that time.  Patient does not smoke likes to be active and has not noted active drainage   Review of Systems  All other systems reviewed and are negative.       Objective:  Physical Exam Vitals and nursing note reviewed.  Constitutional:      Appearance: She is well-developed.  Pulmonary:     Effort: Pulmonary effort is normal.  Musculoskeletal:        General: Normal range of motion.  Skin:    General: Skin is warm.  Neurological:     Mental Status: She is alert.     Neurovascular status intact muscle strength adequate range of motion within normal limits with incurvated right hallux medial border that is very tender with no active drainage or redness noted around the nailbed.  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  Patient wants the procedure done and today I infiltrated 60 mg like Marcaine mixture sterile prep applied to the toe and using sterile instrumentation remove border exposed matrix and applied phenol 3 applications 30 seconds followed by alcohol lavage and sterile dressing.  Gave instructions on soaks and reappoint

## 2020-01-04 ENCOUNTER — Ambulatory Visit (INDEPENDENT_AMBULATORY_CARE_PROVIDER_SITE_OTHER): Payer: BLUE CROSS/BLUE SHIELD | Admitting: Podiatry

## 2020-01-04 ENCOUNTER — Other Ambulatory Visit: Payer: Self-pay

## 2020-01-04 ENCOUNTER — Encounter: Payer: Self-pay | Admitting: Podiatry

## 2020-01-04 DIAGNOSIS — L600 Ingrowing nail: Secondary | ICD-10-CM | POA: Diagnosis not present

## 2020-01-05 NOTE — Progress Notes (Signed)
Subjective:   Patient ID: Katrina Barrett, female   DOB: 54 y.o.   MRN: [id number]   HPI Patient presents stating she was concerned about the corner of her big toenail right that it had some dark discoloration and she is concerned about infection   ROS      Objective:  Physical Exam  Neurovascular status intact with patient noted to have a crusted lateral border right hallux localized with no current proximal edema erythema or drainage noted     Assessment:  Small area of chronic crusted tissue formation right localized with no indications of proximal infection process     Plan:  H&P conditions reviewed and today sterile debridement of tissue occurred along with flushing the area and did not note any extension.  Advised on finishing up soaks and bandage usage and will be seen back as needed

## 2021-05-02 ENCOUNTER — Ambulatory Visit: Payer: BLUE CROSS/BLUE SHIELD | Admitting: Sports Medicine

## 2021-05-16 ENCOUNTER — Ambulatory Visit: Payer: BLUE CROSS/BLUE SHIELD | Admitting: Sports Medicine

## 2021-05-31 ENCOUNTER — Ambulatory Visit (INDEPENDENT_AMBULATORY_CARE_PROVIDER_SITE_OTHER): Payer: Self-pay

## 2021-05-31 ENCOUNTER — Other Ambulatory Visit: Payer: Self-pay | Admitting: Sports Medicine

## 2021-05-31 ENCOUNTER — Other Ambulatory Visit: Payer: Self-pay

## 2021-05-31 ENCOUNTER — Ambulatory Visit: Payer: Self-pay | Admitting: Sports Medicine

## 2021-05-31 ENCOUNTER — Other Ambulatory Visit: Payer: Self-pay | Admitting: *Deleted

## 2021-05-31 ENCOUNTER — Encounter: Payer: Self-pay | Admitting: Sports Medicine

## 2021-05-31 DIAGNOSIS — M7750 Other enthesopathy of unspecified foot: Secondary | ICD-10-CM

## 2021-05-31 DIAGNOSIS — M79674 Pain in right toe(s): Secondary | ICD-10-CM

## 2021-05-31 DIAGNOSIS — M2061 Acquired deformities of toe(s), unspecified, right foot: Secondary | ICD-10-CM

## 2021-05-31 DIAGNOSIS — M898X7 Other specified disorders of bone, ankle and foot: Secondary | ICD-10-CM

## 2021-05-31 DIAGNOSIS — M898X9 Other specified disorders of bone, unspecified site: Secondary | ICD-10-CM

## 2021-05-31 DIAGNOSIS — L600 Ingrowing nail: Secondary | ICD-10-CM

## 2021-05-31 NOTE — Progress Notes (Signed)
Subjective: Katrina Barrett is a 55 y.o. female patient seen today in office with complaint of mildly right hallux lateral margin at area of previous ingrown that was performed by Dr. Charlsie Merles last year patient reports that the toenail corner has remained sore ever since the procedure very tender to touch has episodes of dead skin that builds up to the area that is white and gets very sore that she does not want anybody to touch the corner for her toe states that she did go to get a pedicure this week but did not have the pedicurist to touch or trim that area.  Patient admits that there is some swelling and pain but denies any redness warmth drainage smell or odor.  Patient reports that she has been self treating by soaking her feet to soften the area and sometimes cleaning out some of the debris's in the corner.  Patient reports that it is painful and she still has not gotten any relief since last year with her nail issue.  Patient denies any other pedal complaints at this time.  There are no problems to display for this patient.   No current outpatient medications on file prior to visit.   No current facility-administered medications on file prior to visit.    No Known Allergies  Objective: Physical Exam  General: Well developed, nourished, no acute distress, awake, alert and oriented x 3  Vascular: Dorsalis pedis artery 2/4 bilateral, Posterior tibial artery 2/4 bilateral, skin temperature warm to warm proximal to distal bilateral lower extremities, no varicosities, pedal hair present bilateral.  Neurological: Gross sensation present via light touch bilateral.   Dermatological: Skin is warm, dry, and supple bilateral, to the right hallux lateral nail fold there is a small amount of hard/hyperkeratotic tissue present.  There is no significant redness warmth drainage and very minimal/localized swelling.  No other signs of infection bilateral.  Toenails are well polished.  Musculoskeletal:  There is pain to palpation around the lateral nail fold no significant exostosis but however due to the chronicity of patient's symptoms there is a small concern for possible underlying exostosis.    X-rays are negative for any subungual exostosis  Assessment and Plan:  Problem List Items Addressed This Visit   None Visit Diagnoses     Deformity of toe of right foot    -  Primary   Bony exostosis       Ingrown nail       Pain around toenail, right foot           -Examined patient -Medical history reviewed -Discussed treatment options for painful right hallux nail with pain around the lateral nail fold -X-rays reviewed negative for any subungual exostosis -Nails are well manicured so at this time did not perform any debridement or procedure also patient reports that she will be traveling out of town and does not want to have the procedure at this time -Advised patient since she is still having pain may benefit from a repeat right hallux lateral nail fold matrixectomy -Advised patient that she may benefit from having this procedure closer to the end of the week to have time off from work to rest her toe for at least 3 days after the procedure -Also advised patient since she had a lot of pain with the previous procedure we can be proactive and make sure she has pain medicine in a surgical shoe to help with her problem -Patient to return when ready for repeat right hallux lateral  nail fold matrixectomy (11750) or sooner if symptoms worsen.  Asencion Islam, DPM

## 2021-07-11 ENCOUNTER — Ambulatory Visit: Payer: Self-pay | Admitting: Sports Medicine

## 2021-11-15 ENCOUNTER — Encounter (HOSPITAL_BASED_OUTPATIENT_CLINIC_OR_DEPARTMENT_OTHER): Payer: Self-pay

## 2021-11-15 ENCOUNTER — Emergency Department (HOSPITAL_BASED_OUTPATIENT_CLINIC_OR_DEPARTMENT_OTHER)
Admission: EM | Admit: 2021-11-15 | Discharge: 2021-11-15 | Disposition: A | Discharge status: Home / Self Care | Payer: BLUE CROSS/BLUE SHIELD | Attending: Emergency Medicine | Admitting: Emergency Medicine

## 2021-11-15 ENCOUNTER — Other Ambulatory Visit: Payer: Self-pay

## 2021-11-15 ENCOUNTER — Emergency Department (HOSPITAL_BASED_OUTPATIENT_CLINIC_OR_DEPARTMENT_OTHER): Payer: BLUE CROSS/BLUE SHIELD

## 2021-11-15 DIAGNOSIS — R002 Palpitations: Secondary | ICD-10-CM | POA: Insufficient documentation

## 2021-11-15 DIAGNOSIS — Z20822 Contact with and (suspected) exposure to covid-19: Secondary | ICD-10-CM | POA: Diagnosis not present

## 2021-11-15 LAB — CBC
HCT: 38.1 % (ref 36.0–46.0)
Hemoglobin: 12.4 g/dL (ref 12.0–15.0)
MCH: 26.7 pg (ref 26.0–34.0)
MCHC: 32.5 g/dL (ref 30.0–36.0)
MCV: 82.1 fL (ref 80.0–100.0)
Platelets: 237 10*3/uL (ref 150–400)
RBC: 4.64 MIL/uL (ref 3.87–5.11)
RDW: 13.2 % (ref 11.5–15.5)
WBC: 3.8 10*3/uL — ABNORMAL LOW (ref 4.0–10.5)
nRBC: 0 % (ref 0.0–0.2)

## 2021-11-15 LAB — BASIC METABOLIC PANEL
Anion gap: 8 (ref 5–15)
BUN: 15 mg/dL (ref 6–20)
CO2: 28 mmol/L (ref 22–32)
Calcium: 9.7 mg/dL (ref 8.9–10.3)
Chloride: 103 mmol/L (ref 98–111)
Creatinine, Ser: 0.7 mg/dL (ref 0.44–1.00)
GFR, Estimated: >60 mL/min (ref >60)
Glucose, Bld: 117 mg/dL — ABNORMAL HIGH (ref 70–99)
Potassium: 3.8 mmol/L (ref 3.5–5.1)
Sodium: 139 mmol/L (ref 135–145)

## 2021-11-15 LAB — TROPONIN I (HIGH SENSITIVITY)
Troponin I (High Sensitivity): <2 ng/L (ref <18)
Troponin I (High Sensitivity): 2 ng/L (ref <18)

## 2021-11-15 LAB — RESP PANEL BY RT-PCR (FLU A&B, COVID) ARPGX2
Influenza A by PCR: NEGATIVE
Influenza B by PCR: NEGATIVE
SARS Coronavirus 2 by RT PCR: NEGATIVE

## 2021-11-15 LAB — TSH: TSH: 1.746 u[IU]/mL (ref 0.350–4.500)

## 2021-11-15 LAB — MAGNESIUM: Magnesium: 1.6 mg/dL — ABNORMAL LOW (ref 1.7–2.4)

## 2021-11-15 MED ORDER — MAGNESIUM SULFATE 2 GM/50ML IV SOLN
2.0000 g | Freq: Once | INTRAVENOUS | Status: AC
  Administered 2021-11-15: 2 g via INTRAVENOUS
  Filled 2021-11-15: qty 50

## 2021-11-15 NOTE — ED Triage Notes (Signed)
Patient here POV from Home with Palpitations.  Patient states this AM she awoke with Palpitations in Chest that has since progressed into Heaviness in Left Arm and Nausea as well.  No History of Arrhythmia. A&Ox4. GCS 15. Ambulatory.

## 2021-11-15 NOTE — ED Provider Notes (Signed)
[facility] EMERGENCY DEPT Provider Note   CSN: [id number] Arrival date & time: 11/15/21  1545     History Chief Complaint  Patient presents with   Palpitations    Katrina Barrett is a 55 y.o. female.  Pt presents to the ED today with palpitations.  She said she woke up this am with fluttering in her chest.  She said it has continued all day, so she came in for further eval.  She also feels that her left arm is a little heavy.  No hx of cad.  No new meds.  No n/v or sob.      History reviewed. No pertinent past medical history.  There are no problems to display for this patient.   Past Surgical History:  Procedure Laterality Date   TUBAL LIGATION     UTERINE FIBROID SURGERY       OB History     Gravida  4   Para  2   Term  2   Preterm      AB  2   Living  2      SAB      IAB  2   Ectopic      Multiple      Live Births              Family History  Problem Relation Age of Onset   Hypertension Mother    Thyroid disease Maternal Aunt     Social History   Tobacco Use   Smoking status: Never   Smokeless tobacco: Never  Substance Use Topics   Alcohol use: No   Drug use: No    Home Medications Prior to Admission medications   Not on File    Allergies    Patient has no known allergies.  Review of Systems   Review of Systems  Cardiovascular:  Positive for palpitations.  All other systems reviewed and are negative.  Physical Exam Updated Vital Signs BP (!) 157/93    Pulse 75    Temp 98.4 F (36.9 C) (Oral)    Resp 15    Ht 5\' 6"  (1.676 m)    Wt 78.9 kg    LMP 08/26/2014    SpO2 100%    BMI 28.08 kg/m   Physical Exam Vitals and nursing note reviewed.  Constitutional:      Appearance: Normal appearance.  HENT:     Head: Normocephalic and atraumatic.     Right Ear: External ear normal.     Left Ear: External ear normal.     Nose: Nose normal.     Mouth/Throat:     Mouth: Mucous membranes are moist.      Pharynx: Oropharynx is clear.  Eyes:     Extraocular Movements: Extraocular movements intact.     Conjunctiva/sclera: Conjunctivae normal.     Pupils: Pupils are equal, round, and reactive to light.  Cardiovascular:     Rate and Rhythm: Normal rate and regular rhythm.     Pulses: Normal pulses.     Heart sounds: Normal heart sounds.  Pulmonary:     Effort: Pulmonary effort is normal.     Breath sounds: Normal breath sounds.  Abdominal:     General: Abdomen is flat. Bowel sounds are normal.     Palpations: Abdomen is soft.  Musculoskeletal:        General: Normal range of motion.     Cervical back: Normal range of motion and neck supple.  Skin:  General: Skin is warm.     Capillary Refill: Capillary refill takes less than 2 seconds.  Neurological:     General: No focal deficit present.     Mental Status: She is alert and oriented to person, place, and time.  Psychiatric:        Mood and Affect: Mood normal.        Behavior: Behavior normal.    ED Results / Procedures / Treatments   Labs (all labs ordered are listed, but only abnormal results are displayed) Labs Reviewed  BASIC METABOLIC PANEL - Abnormal; Notable for the following components:      Result Value   Glucose, Bld 117 (*)    All other components within normal limits  CBC - Abnormal; Notable for the following components:   WBC 3.8 (*)    All other components within normal limits  MAGNESIUM - Abnormal; Notable for the following components:   Magnesium 1.6 (*)    All other components within normal limits  RESP PANEL BY RT-PCR (FLU A&B, COVID) ARPGX2  TSH  TROPONIN I (HIGH SENSITIVITY)  TROPONIN I (HIGH SENSITIVITY)    EKG EKG Interpretation  Date/Time:  Friday November 15 2021 15:51:24 EST Ventricular Rate:  82 PR Interval:  160 QRS Duration: 94 QT Interval:  356 QTC Calculation: 415 R Axis:   41 Text Interpretation: Normal sinus rhythm Incomplete right bundle branch block Borderline ECG No  significant change since last tracing Confirmed by Jacalyn Lefevre [phone]) on 11/15/2021 4:18:51 PM  Radiology DG Chest Portable 1 View  Result Date: 11/15/2021 CLINICAL DATA:  Palpitations EXAM: PORTABLE CHEST 1 VIEW COMPARISON:  06/29/2004 FINDINGS: The heart size and mediastinal contours are within normal limits. Both lungs are clear. The visualized skeletal structures are unremarkable. IMPRESSION: No active disease. Electronically Signed   By: Marlan Palau M.D.   On: 11/15/2021 16:36    Procedures Procedures   Medications Ordered in ED Medications  magnesium sulfate IVPB 2 g 50 mL (2 g Intravenous New Bag/Given 11/15/21 1746)    ED Course  I have reviewed the triage vital signs and the nursing notes.  Pertinent labs & imaging results that were available during my care of the patient were reviewed by me and considered in my medical decision making (see chart for details).    MDM Rules/Calculators/A&P                         Pt has not had any palpitations while here.  Mg is low at 1.6.  K is nl.  Pt given 2 g Mg.  She has a negative work up otherwise.  She is given the number to cardiology to f/u if sx worsen.  Return if worse.  Final Clinical Impression(s) / ED Diagnoses Final diagnoses:  Palpitations  Hypomagnesemia    Rx / DC Orders ED Discharge Orders     None        Jacalyn Lefevre, MD 11/15/21 [phone]

## 2021-11-15 NOTE — ED Notes (Signed)
RN provided AVS using Teachback Method. Patient verbalizes understanding of Discharge Instructions. Opportunity for Questioning and Answers were provided by RN. Patient Discharged from ED ambulatory to Home via Self.
